# Patient Record
Sex: Male | Born: 1965 | Race: White | Hispanic: No | Marital: Single | Smoking: Current every day smoker
Health system: Southern US, Community
[De-identification: ages and names within clinical notes are randomized; demographics above are authoritative.]

## PROBLEM LIST (undated history)

## (undated) DIAGNOSIS — I2510 Atherosclerotic heart disease of native coronary artery without angina pectoris: Secondary | ICD-10-CM

## (undated) DIAGNOSIS — E119 Type 2 diabetes mellitus without complications: Secondary | ICD-10-CM

---

## 2017-08-05 ENCOUNTER — Emergency Department (HOSPITAL_COMMUNITY): Payer: Self-pay

## 2017-08-05 ENCOUNTER — Emergency Department (HOSPITAL_COMMUNITY)
Admission: EM | Admit: 2017-08-05 | Discharge: 2017-08-05 | Disposition: A | Discharge status: Home / Self Care | Payer: Self-pay | Attending: Physician Assistant | Admitting: Physician Assistant

## 2017-08-05 ENCOUNTER — Encounter (HOSPITAL_COMMUNITY): Payer: Self-pay | Admitting: Emergency Medicine

## 2017-08-05 DIAGNOSIS — E119 Type 2 diabetes mellitus without complications: Secondary | ICD-10-CM | POA: Insufficient documentation

## 2017-08-05 DIAGNOSIS — I2510 Atherosclerotic heart disease of native coronary artery without angina pectoris: Secondary | ICD-10-CM | POA: Insufficient documentation

## 2017-08-05 DIAGNOSIS — F17210 Nicotine dependence, cigarettes, uncomplicated: Secondary | ICD-10-CM | POA: Insufficient documentation

## 2017-08-05 DIAGNOSIS — R0789 Other chest pain: Secondary | ICD-10-CM | POA: Insufficient documentation

## 2017-08-05 DIAGNOSIS — R079 Chest pain, unspecified: Secondary | ICD-10-CM

## 2017-08-05 HISTORY — DX: Atherosclerotic heart disease of native coronary artery without angina pectoris: I25.10

## 2017-08-05 HISTORY — DX: Type 2 diabetes mellitus without complications: E11.9

## 2017-08-05 LAB — CBC
HCT: 40.8 % (ref 39.0–52.0)
Hemoglobin: 13.7 g/dL (ref 13.0–17.0)
MCH: 30.3 pg (ref 26.0–34.0)
MCHC: 33.6 g/dL (ref 30.0–36.0)
MCV: 90.3 fL (ref 78.0–100.0)
Platelets: 202 10*3/uL (ref 150–400)
RBC: 4.52 MIL/uL (ref 4.22–5.81)
RDW: 12.6 % (ref 11.5–15.5)
WBC: 4.4 10*3/uL (ref 4.0–10.5)

## 2017-08-05 LAB — BASIC METABOLIC PANEL
Anion gap: 8 (ref 5–15)
BUN: 9 mg/dL (ref 6–20)
CO2: 25 mmol/L (ref 22–32)
Calcium: 9.4 mg/dL (ref 8.9–10.3)
Chloride: 105 mmol/L (ref 101–111)
Creatinine, Ser: 0.88 mg/dL (ref 0.61–1.24)
GFR calc Af Amer: >60 mL/min (ref >60)
GFR calc non Af Amer: >60 mL/min (ref >60)
Glucose, Bld: 96 mg/dL (ref 65–99)
Potassium: 4 mmol/L (ref 3.5–5.1)
Sodium: 138 mmol/L (ref 135–145)

## 2017-08-05 LAB — COMPREHENSIVE METABOLIC PANEL
ALT: 18 U/L (ref 17–63)
AST: 24 U/L (ref 15–41)
Albumin: 3.6 g/dL (ref 3.5–5.0)
Alkaline Phosphatase: 62 U/L (ref 38–126)
Anion gap: 8 (ref 5–15)
BUN: 9 mg/dL (ref 6–20)
CO2: 24 mmol/L (ref 22–32)
Calcium: 9.1 mg/dL (ref 8.9–10.3)
Chloride: 105 mmol/L (ref 101–111)
Creatinine, Ser: 0.82 mg/dL (ref 0.61–1.24)
GFR calc Af Amer: >60 mL/min (ref >60)
GFR calc non Af Amer: >60 mL/min (ref >60)
Glucose, Bld: 96 mg/dL (ref 65–99)
Potassium: 4.1 mmol/L (ref 3.5–5.1)
Sodium: 137 mmol/L (ref 135–145)
Total Bilirubin: 0.9 mg/dL (ref 0.3–1.2)
Total Protein: 6.1 g/dL — ABNORMAL LOW (ref 6.5–8.1)

## 2017-08-05 LAB — I-STAT TROPONIN, ED
Troponin i, poc: 0.01 ng/mL (ref 0.00–0.08)
Troponin i, poc: 0 ng/mL (ref 0.00–0.08)

## 2017-08-05 LAB — TROPONIN I: Troponin I: <0.03 ng/mL (ref <0.03)

## 2017-08-05 NOTE — ED Triage Notes (Signed)
Pt has chest pain for 4-5 years comes and goes. Was at doctor's today and sent for EKG abnormal. Gave aspirin at office and he states it eased off. Smoker. Denies nausea or diaphoretic. Endorses SOb.

## 2017-08-05 NOTE — ED Notes (Signed)
POCKET KNIFE GIVEN TO SECURITY

## 2017-08-05 NOTE — ED Notes (Signed)
Patient repeat BP x 2 hypotensive denies feeling of passing out states just feels tired. Alert answering and following commands appropriate.

## 2017-08-05 NOTE — ED Notes (Signed)
Pt ambulated on the hallway with steady gait, HR up from 45 to 67bpm, respiration up to 35, SPO2 100%, no c/o pain, no SOB, no distress noticed.

## 2017-08-05 NOTE — Discharge Instructions (Signed)
Please follow up with your primary and with cardiology for your slow heart rate. Return with any chest pain dizziness, faintness, or any toher concerns.

## 2017-08-05 NOTE — ED Provider Notes (Signed)
[facility]-EMERGENCY DEPT Provider Note   CSN: [id number] Arrival date & time: 08/05/17  1137     History   Chief Complaint Chief Complaint  Patient presents with  . Chest Pain    HPI Peter Haas is a 51 y.o. male.  HPI  Pt is a 51 yo male presenting with discomfort in his ribcage. Patient reports it is constant. Reports was going on for 2-3 weeks. He is unable to tell me that makes it better or worse. It is not associated with  eating. Not associated with exertion. No SOB.  Patient had hospitalization at Hospital for this 2 years ago. He denies any stress testing or catheterization. Patient has history of cholesterol, diabetes.  Past Medical History:  Diagnosis Date  . Coronary artery disease   . Diabetes mellitus without complication (HCC)     There are no active problems to display for this patient.   History reviewed. No pertinent surgical history.     Home Medications    Prior to Admission medications   Not on File    Family History History reviewed. No pertinent family history.  Social History Social History  Substance Use Topics  . Smoking status: Current Every Day Smoker    Packs/day: 0.50    Types: Cigarettes  . Smokeless tobacco: Never Used  . Alcohol use Not on file     Allergies   Patient has no known allergies.   Review of Systems Review of Systems  Constitutional: Negative for activity change.  Respiratory: Positive for chest tightness. Negative for shortness of breath.   Cardiovascular: Negative for chest pain.  Gastrointestinal: Negative for abdominal pain.  All other systems reviewed and are negative.    Physical Exam Updated Vital Signs BP 105/68 (BP Location: Right Arm)   Pulse (!) 47   Temp 97.8 F (36.6 C) (Oral)   Resp 18   SpO2 99%   Physical Exam  Constitutional: He is oriented to person, place, and time. He appears well-nourished.  HENT:  Head: Normocephalic and atraumatic.  Eyes: Conjunctivae and EOM are normal.    Cardiovascular: Normal rate and regular rhythm.   Pulmonary/Chest: Effort normal and breath sounds normal.  Abdominal: Soft. He exhibits no distension. There is no tenderness.  Neurological: He is oriented to person, place, and time.  Skin: Skin is warm and dry. He is not diaphoretic.  Psychiatric: He has a normal mood and affect. His behavior is normal.     ED Treatments / Results  Labs (all labs ordered are listed, but only abnormal results are displayed) Labs Reviewed  COMPREHENSIVE METABOLIC PANEL - Abnormal; Notable for the following:       Result Value   Total Protein 6.1 (*)    All other components within normal limits  BASIC METABOLIC PANEL  CBC  TROPONIN I  CBC WITH DIFFERENTIAL/PLATELET  I-STAT TROPONIN, ED    EKG  EKG Interpretation None       Radiology Dg Chest 2 View  Result Date: 08/05/2017 CLINICAL DATA:  Chest pain. EXAM: CHEST  2 VIEW COMPARISON:  None. FINDINGS: The heart size and mediastinal contours are within normal limits. Both lungs are clear. No pneumothorax or pleural effusion is noted. The visualized skeletal structures are unremarkable. IMPRESSION: No active cardiopulmonary disease. Electronically Signed   By: Lupita RaiderJames  Green Jr, M.D.   On: 08/05/2017 12:30    Procedures Procedures (including critical care time)  Medications Ordered in ED Medications - No data to display   Initial Impression /  Assessment and Plan / ED Course  I have reviewed the triage vital signs and the nursing notes.  Pertinent labs & imaging results that were available during my care of the patient were reviewed by me and considered in my medical decision making (see chart for details).      Heart scores 3. We'll plan to delta troponin. He appears very healthy here. He does have low heart rate but regular sinus. He does not appear in pain.  No risk factors for PE.    7:15 PM Troponins are negative. Patient has asymptomatic bradycardia. He has no dizziness upon  walking no shortness of breath.  We will refer him to a cardiologist to follow-up. In the follow-up with his primary care physician. We told him with any dizziness, shortness breath, chest pain to return me late emergency department. Final Clinical Impressions(s) / ED Diagnoses   Final diagnoses:  None    New Prescriptions New Prescriptions   No medications on file     Abelino Derrick, MD 08/05/17 2350

## 2018-04-28 IMAGING — DX DG CHEST 2V
2 series · 2 of 2 positions shown · non-contrast
Comparison: Today 6799 hour

CLINICAL DATA: Chest pain for several weeks

EXAM:
CHEST  2 VIEW

[w chest lat]
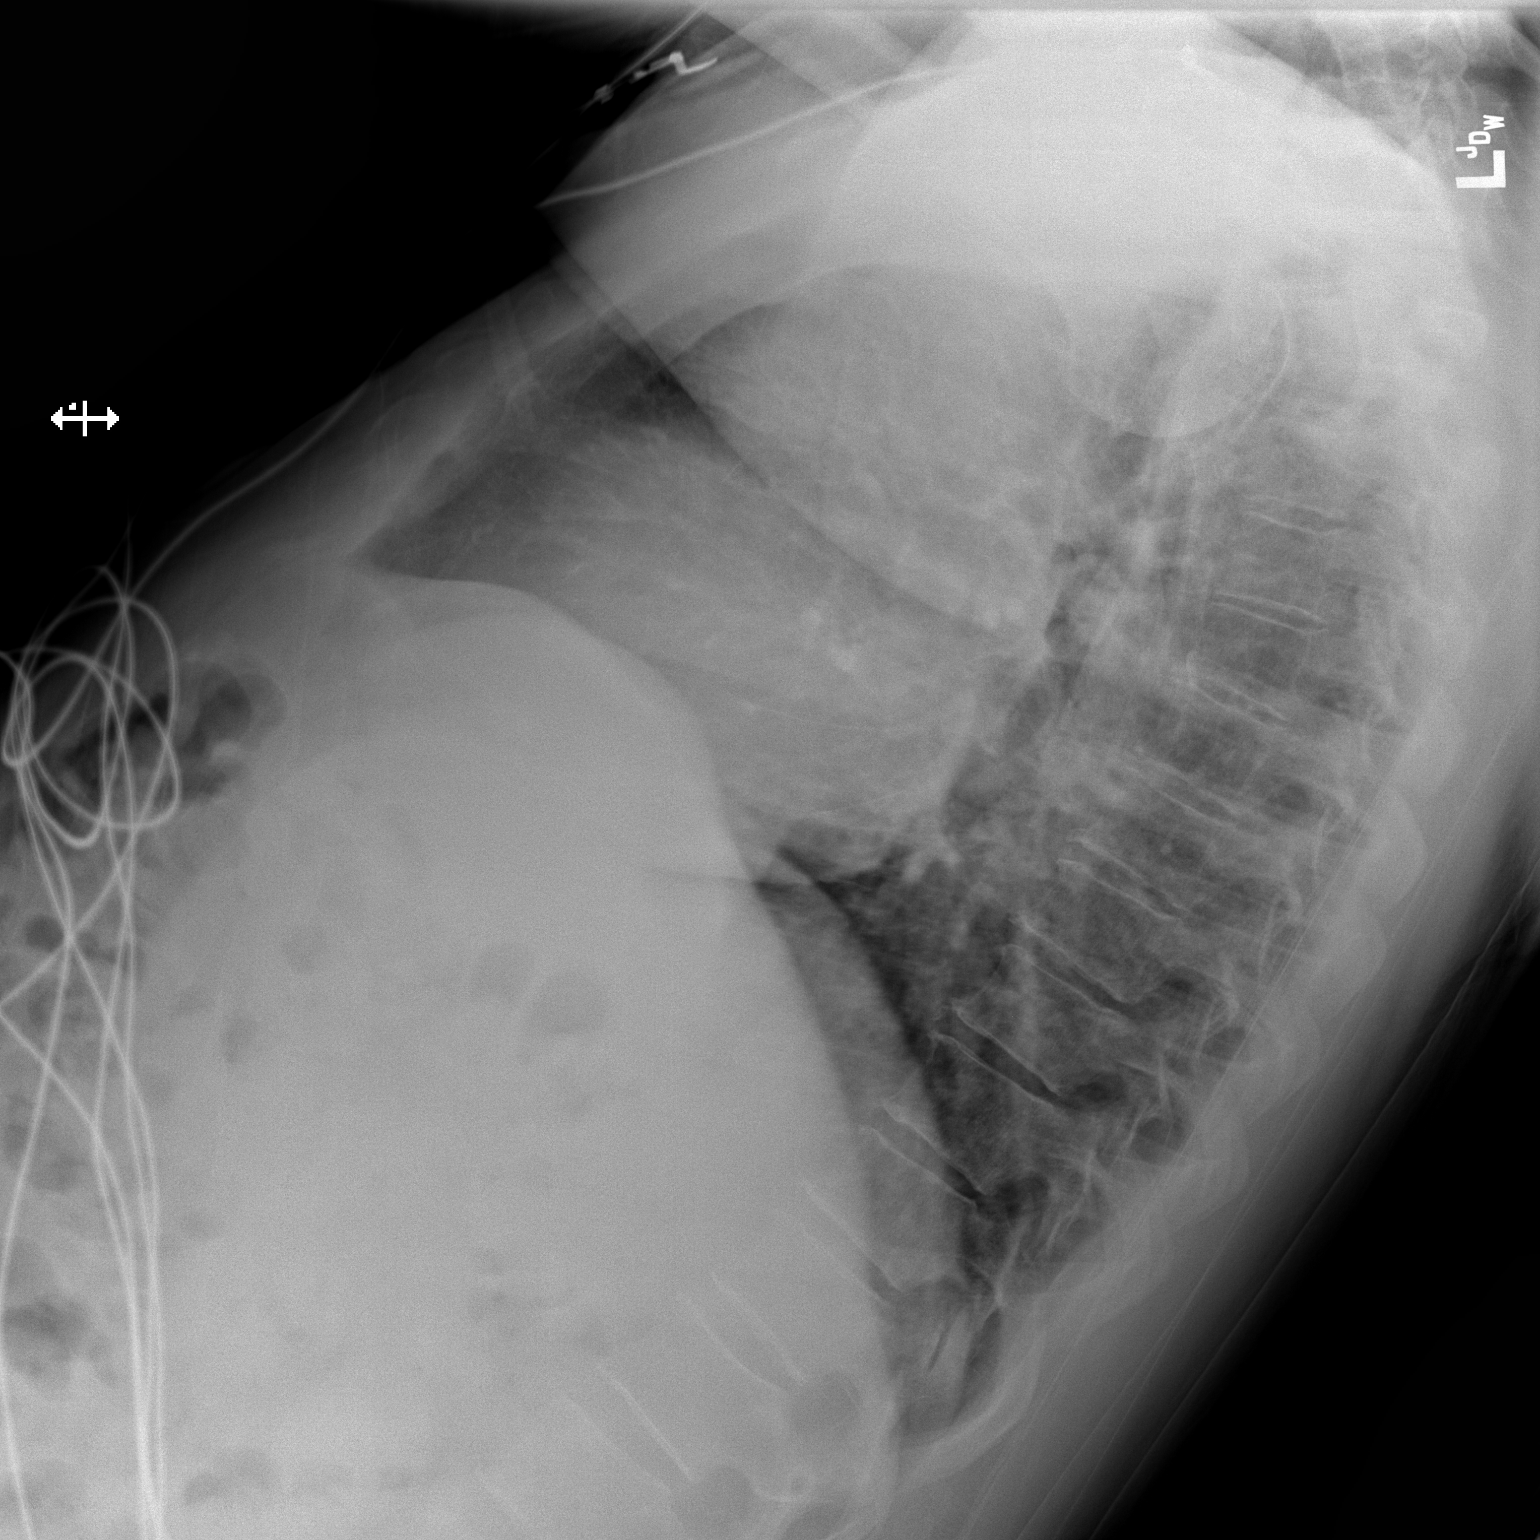

[x chest ap]
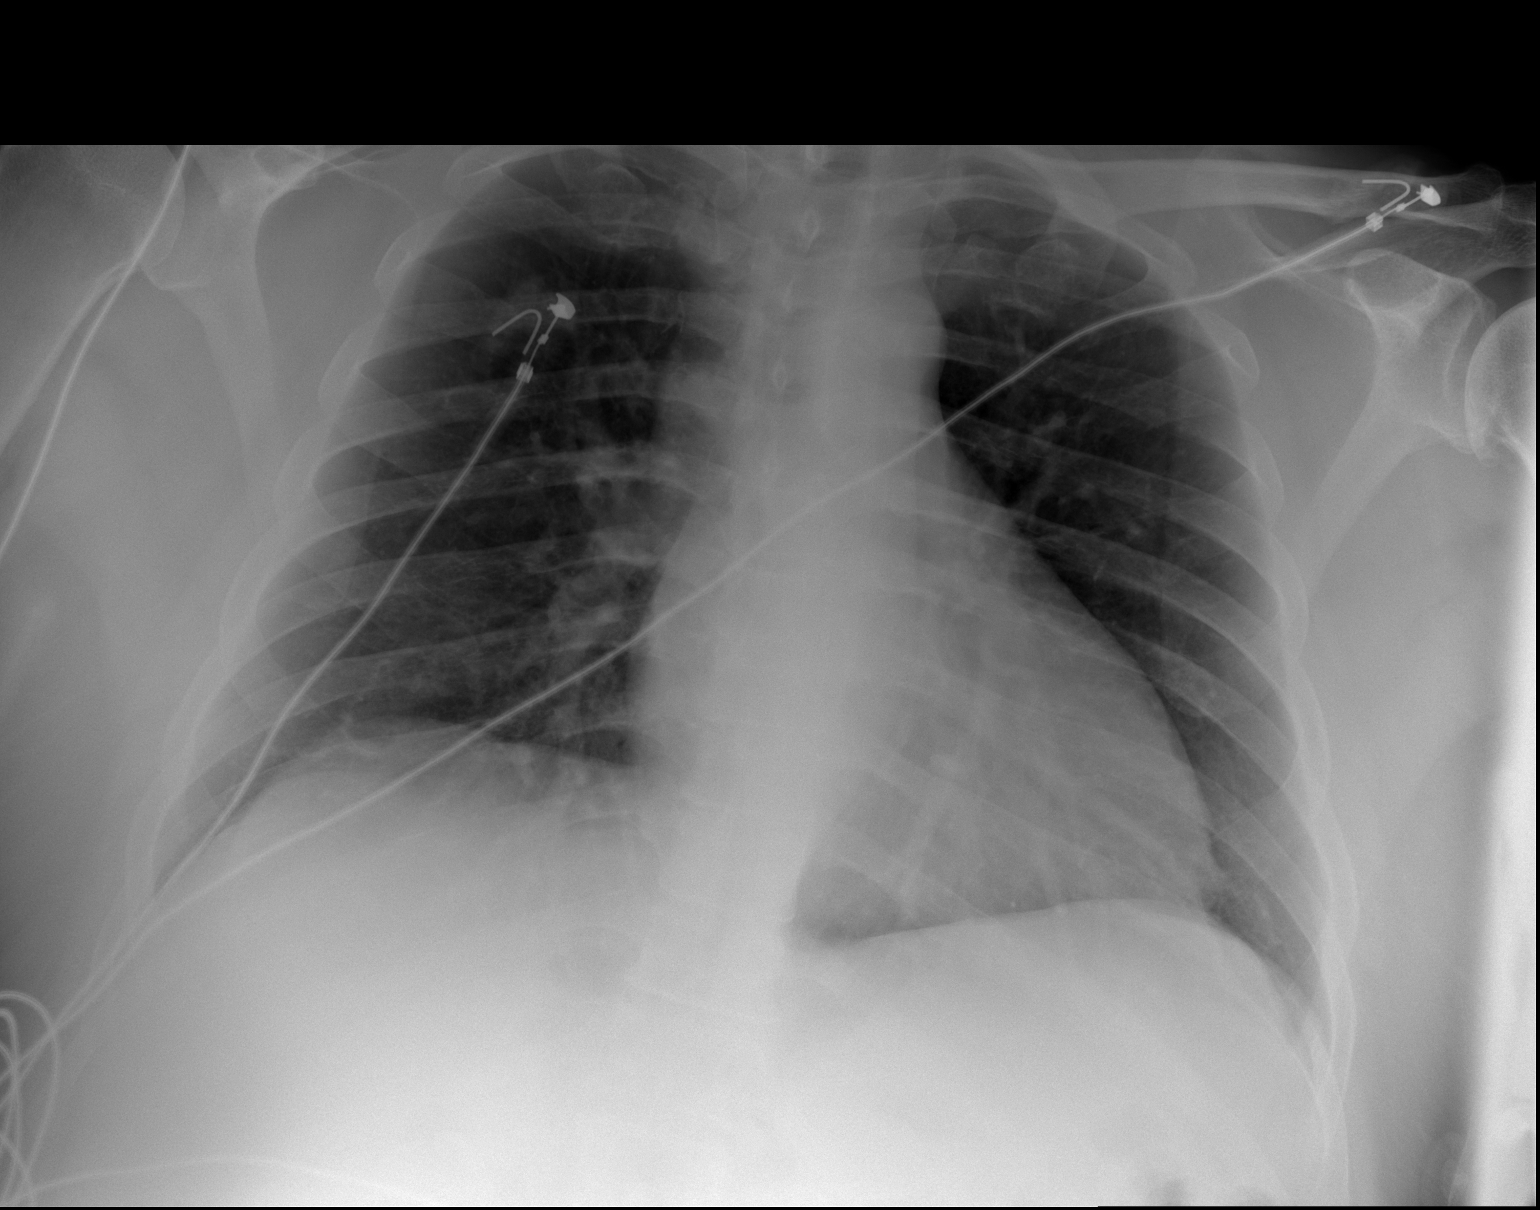

[2 of 2 positions shown; findings below may reference images not displayed]

FINDINGS: Normal heart size. Lungs clear. No pneumothorax. No pleural
effusion.
IMPRESSION: No active cardiopulmonary disease.

## 2021-01-29 ENCOUNTER — Other Ambulatory Visit: Payer: Self-pay

## 2021-01-29 DIAGNOSIS — Z20822 Contact with and (suspected) exposure to covid-19: Secondary | ICD-10-CM

## 2021-01-30 LAB — NOVEL CORONAVIRUS, NAA: SARS-CoV-2, NAA: DETECTED — AB

## 2021-01-30 LAB — SARS-COV-2, NAA 2 DAY TAT

## 2022-11-28 DIAGNOSIS — Z419 Encounter for procedure for purposes other than remedying health state, unspecified: Secondary | ICD-10-CM | POA: Diagnosis not present

## 2022-12-29 DIAGNOSIS — Z419 Encounter for procedure for purposes other than remedying health state, unspecified: Secondary | ICD-10-CM | POA: Diagnosis not present

## 2023-01-29 DIAGNOSIS — Z419 Encounter for procedure for purposes other than remedying health state, unspecified: Secondary | ICD-10-CM | POA: Diagnosis not present

## 2023-02-27 DIAGNOSIS — Z419 Encounter for procedure for purposes other than remedying health state, unspecified: Secondary | ICD-10-CM | POA: Diagnosis not present

## 2023-05-30 DIAGNOSIS — Z419 Encounter for procedure for purposes other than remedying health state, unspecified: Secondary | ICD-10-CM | POA: Diagnosis not present

## 2023-06-29 DIAGNOSIS — Z419 Encounter for procedure for purposes other than remedying health state, unspecified: Secondary | ICD-10-CM | POA: Diagnosis not present

## 2023-07-30 DIAGNOSIS — Z419 Encounter for procedure for purposes other than remedying health state, unspecified: Secondary | ICD-10-CM | POA: Diagnosis not present

## 2023-08-30 DIAGNOSIS — Z419 Encounter for procedure for purposes other than remedying health state, unspecified: Secondary | ICD-10-CM | POA: Diagnosis not present

## 2023-09-29 DIAGNOSIS — Z419 Encounter for procedure for purposes other than remedying health state, unspecified: Secondary | ICD-10-CM | POA: Diagnosis not present

## 2023-10-30 DIAGNOSIS — Z419 Encounter for procedure for purposes other than remedying health state, unspecified: Secondary | ICD-10-CM | POA: Diagnosis not present

## 2023-11-29 DIAGNOSIS — Z419 Encounter for procedure for purposes other than remedying health state, unspecified: Secondary | ICD-10-CM | POA: Diagnosis not present

## 2023-12-30 DIAGNOSIS — Z419 Encounter for procedure for purposes other than remedying health state, unspecified: Secondary | ICD-10-CM | POA: Diagnosis not present

## 2024-01-10 DIAGNOSIS — Z1211 Encounter for screening for malignant neoplasm of colon: Secondary | ICD-10-CM | POA: Diagnosis not present

## 2024-01-10 DIAGNOSIS — Z1212 Encounter for screening for malignant neoplasm of rectum: Secondary | ICD-10-CM | POA: Diagnosis not present

## 2024-01-30 DIAGNOSIS — Z419 Encounter for procedure for purposes other than remedying health state, unspecified: Secondary | ICD-10-CM | POA: Diagnosis not present

## 2024-02-27 DIAGNOSIS — Z419 Encounter for procedure for purposes other than remedying health state, unspecified: Secondary | ICD-10-CM | POA: Diagnosis not present

## 2024-04-09 DIAGNOSIS — Z419 Encounter for procedure for purposes other than remedying health state, unspecified: Secondary | ICD-10-CM | POA: Diagnosis not present

## 2024-05-09 DIAGNOSIS — Z419 Encounter for procedure for purposes other than remedying health state, unspecified: Secondary | ICD-10-CM | POA: Diagnosis not present

## 2024-06-09 DIAGNOSIS — Z419 Encounter for procedure for purposes other than remedying health state, unspecified: Secondary | ICD-10-CM | POA: Diagnosis not present

## 2024-07-09 DIAGNOSIS — Z419 Encounter for procedure for purposes other than remedying health state, unspecified: Secondary | ICD-10-CM | POA: Diagnosis not present

## 2024-08-09 DIAGNOSIS — Z419 Encounter for procedure for purposes other than remedying health state, unspecified: Secondary | ICD-10-CM | POA: Diagnosis not present

## 2024-09-09 DIAGNOSIS — Z419 Encounter for procedure for purposes other than remedying health state, unspecified: Secondary | ICD-10-CM | POA: Diagnosis not present

## 2024-10-09 DIAGNOSIS — Z419 Encounter for procedure for purposes other than remedying health state, unspecified: Secondary | ICD-10-CM | POA: Diagnosis not present

## 2024-12-09 DIAGNOSIS — Z419 Encounter for procedure for purposes other than remedying health state, unspecified: Secondary | ICD-10-CM | POA: Diagnosis not present
